# Patient Record
Sex: Male | Born: 2000 | Race: Black or African American | Hispanic: No | Marital: Single | Smoking: Never smoker
Health system: Southern US, Community
[De-identification: ages and names within clinical notes are randomized; demographics above are authoritative.]

---
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
## 2021-11-20 IMAGING — DX DG SHOULDER 2+V*L*
3 series · 3 of 3 positions shown · non-contrast
Comparison: None.

CLINICAL DATA: Recent shoulder dislocation. Injury while playing
football

EXAM:
LEFT SHOULDER - 2+ VIEW

[shoulder grashey]
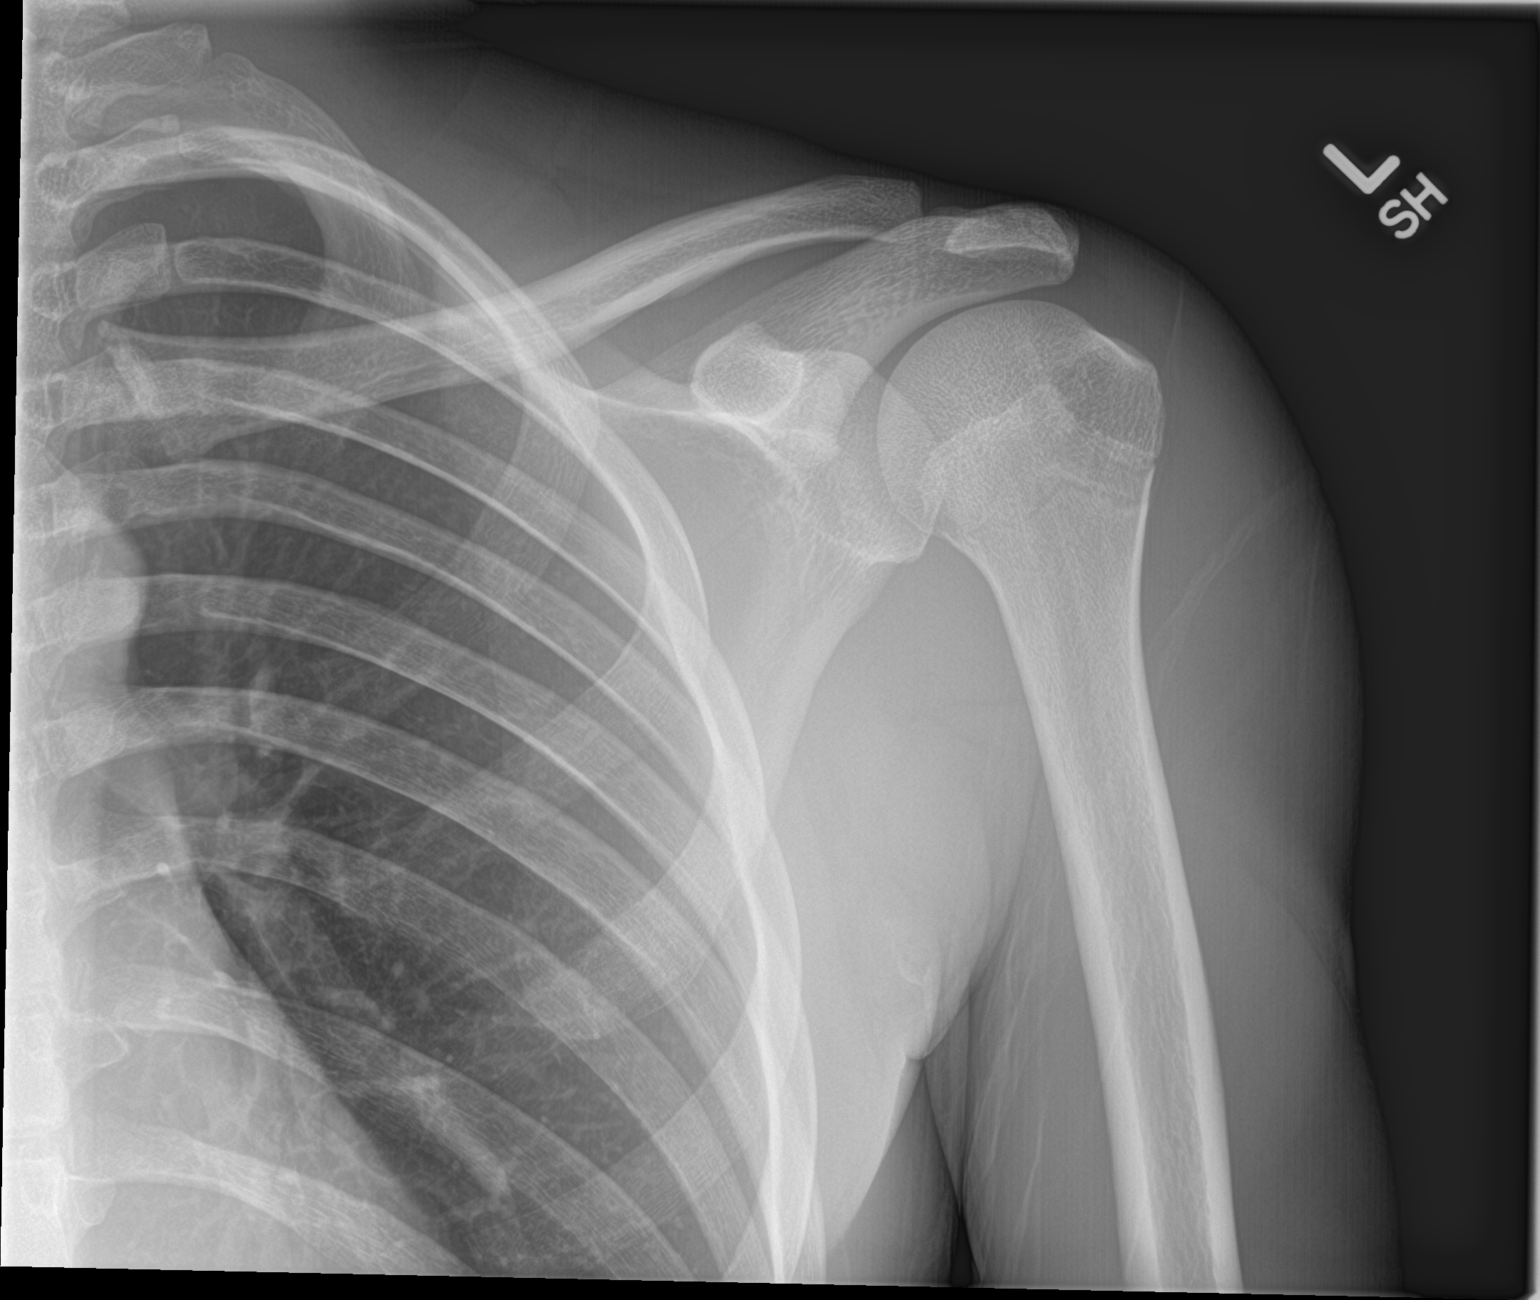

[shoulder y view]
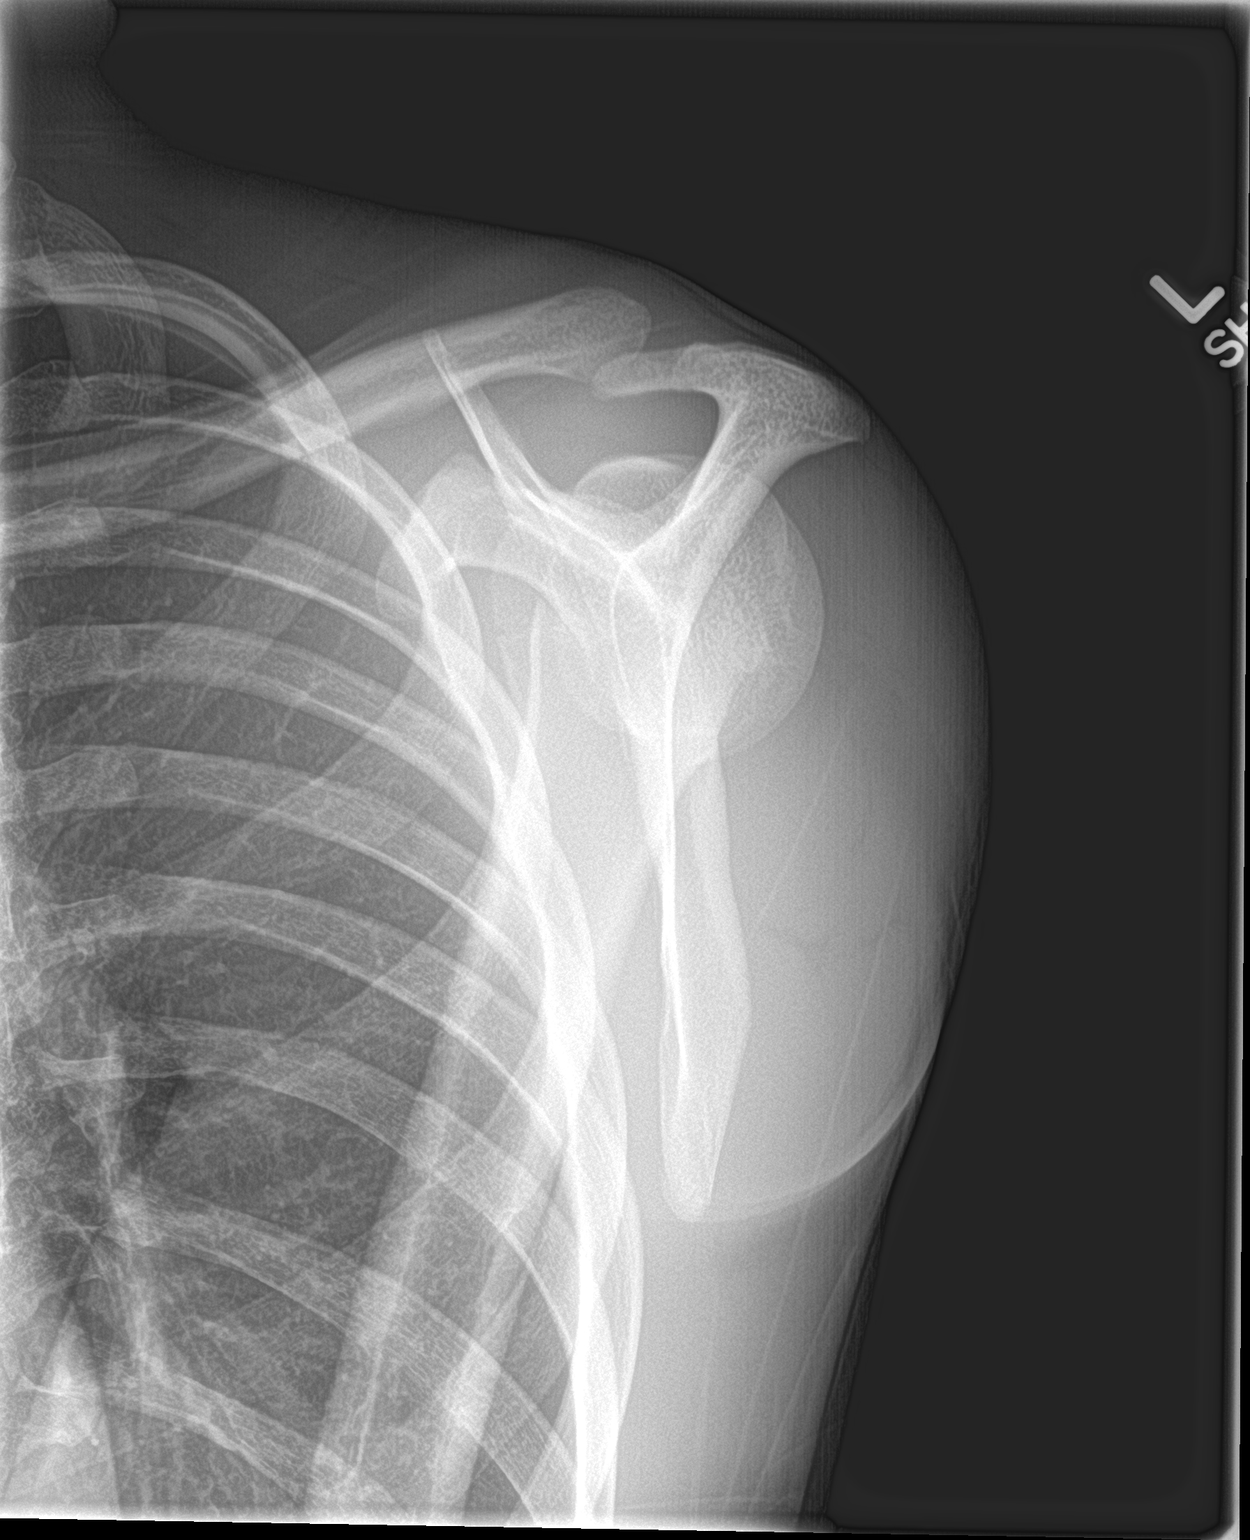

[shoulder axillary]
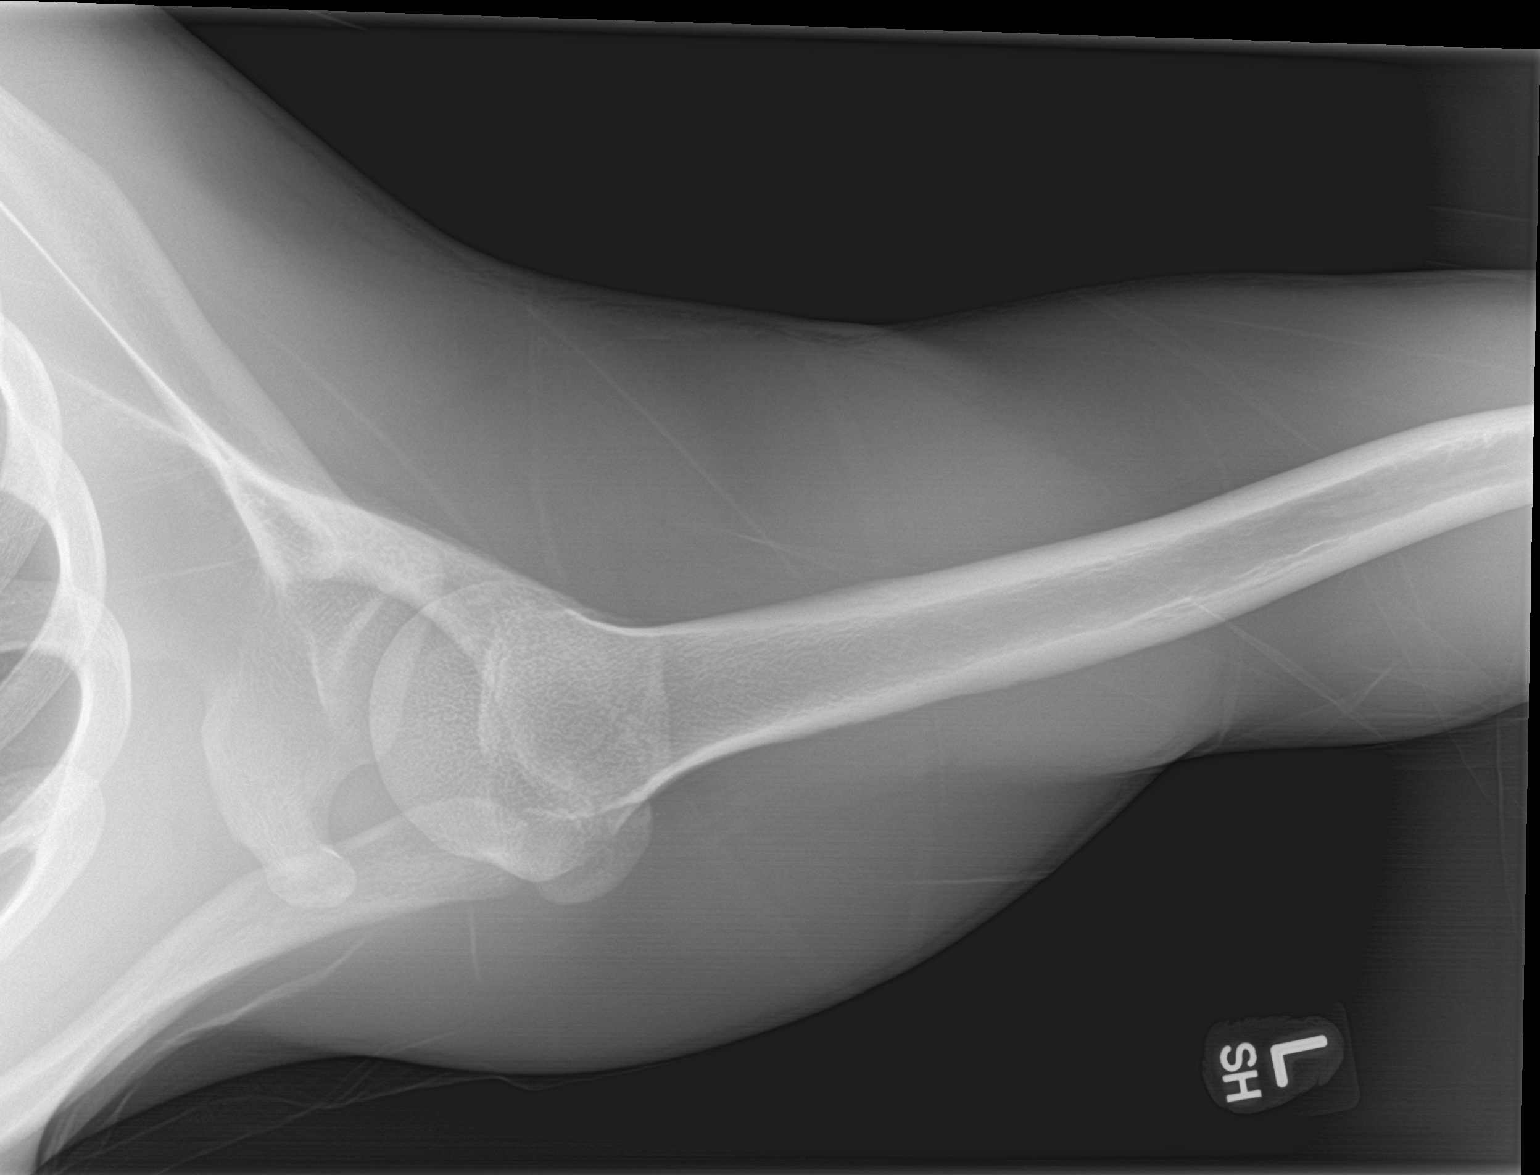

[3 of 3 positions shown; findings below may reference images not displayed]

FINDINGS: Frontal, Y scapular, and axillary images obtained. No fracture or
dislocation. Joint spaces appear normal. No erosive change.
Visualized left lung clear.
IMPRESSION: No fracture or dislocation.  No appreciable arthropathy.

## 2022-11-22 ENCOUNTER — Encounter: Payer: Self-pay | Admitting: *Deleted
